# Patient Record
Sex: Female | Born: 1995 | Hispanic: Yes | Marital: Single | Smoking: Never smoker
Health system: Southern US, Community
[De-identification: ages and names within clinical notes are randomized; demographics above are authoritative.]

## PROBLEM LIST (undated history)

## (undated) ENCOUNTER — Inpatient Hospital Stay (HOSPITAL_COMMUNITY): Payer: Self-pay

---
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
## 2017-01-01 ENCOUNTER — Ambulatory Visit: Payer: Self-pay | Admitting: General Practice

## 2017-01-01 DIAGNOSIS — O3680X0 Pregnancy with inconclusive fetal viability, not applicable or unspecified: Secondary | ICD-10-CM

## 2017-01-01 LAB — HCG, QUANTITATIVE, PREGNANCY: hCG, Beta Chain, Quant, S: 203 m[IU]/mL — ABNORMAL HIGH (ref <5)

## 2017-01-01 NOTE — Progress Notes (Signed)
Patient here for stat bhcg today. Spoke with patient with Okey RegalCarol for interpreter. Patient denies pain or bleeding. Discussed having patient wait in lobby for results & updated plan of care. Patient verbalized understanding. Discussed results with Dr Macon LargeAnyanwu who states bhcg is plateauing and not rising appropriately. Most likely represents non progressing pregnancy. Patient needs repeat bhcg in 48 hours, if levels are almost the same as today patient may need MTX for presumed ectopic pregnancy. Spoke with patient using stratus interpreter [phone]#[id number] & informed her results and updated plan of care. Patient verbalized understanding and boyfriend asked if there was anything they could do to raise the levels or help. Told them unfortunately no at this point we just have to wait and see. They verbalized understanding and had no questions at this time.

## 2017-01-03 ENCOUNTER — Inpatient Hospital Stay (HOSPITAL_COMMUNITY)
Admission: AD | Admit: 2017-01-03 | Discharge: 2017-01-03 | Disposition: A | Discharge status: Home / Self Care | Payer: Self-pay | Source: Ambulatory Visit | Attending: Family Medicine | Admitting: Family Medicine

## 2017-01-03 DIAGNOSIS — O3680X0 Pregnancy with inconclusive fetal viability, not applicable or unspecified: Secondary | ICD-10-CM

## 2017-01-03 DIAGNOSIS — O0281 Inappropriate change in quantitative human chorionic gonadotropin (hCG) in early pregnancy: Secondary | ICD-10-CM | POA: Insufficient documentation

## 2017-01-03 LAB — HCG, QUANTITATIVE, PREGNANCY: hCG, Beta Chain, Quant, S: 321 m[IU]/mL — ABNORMAL HIGH (ref <5)

## 2017-01-03 NOTE — MAU Note (Cosign Needed)
Ms. Olivia MoundOsielit Buttram  is a 21 y.o. G2P0010 at 8933w0d who presents to MAU today for follow-up quant hCG after 48 hours. The patient was seen in MAU on 12/30/16 and had quant hCG of 240 and US showed no IUP or adnexal  mass. [facility] and quant was 203. She denies pain, vaginal bleeding or fever today.   OB History  Gravida Para Term Preterm AB Living  2 0 0 0 1 0  SAB TAB Ectopic Multiple Live Births  1 0 0 0 0    # Outcome Date GA Lbr Len/2nd Weight Sex Delivery Anes PTL Lv  2 Current           1 SAB 05/26/13     SAB         No past medical history on file.  ROS: No VB No pain  BP 122/70 (BP Location: Left Arm)   Pulse 78   Temp 98.6 F (37 C) (Oral)   Resp 16   LMP 11/15/2016   SpO2 100%   CONSTITUTIONAL: Well-developed, well-nourished female in no acute distress.  MUSCULOSKELETAL: Normal range of motion.  CARDIOVASCULAR: Regular heart rate RESPIRATORY: Normal effort NEUROLOGICAL: Alert and oriented to person, place, and time.  SKIN: Not diaphoretic. No erythema. No pallor. PSYCH: Normal mood and affect. Normal behavior. Normal judgment and thought content.  Results for orders placed or performed during the hospital encounter of 01/03/17 (from the past 24 hour(s))  hCG, quantitative, pregnancy     Status: Abnormal   Collection Time: 01/03/17  1:16 PM  Result Value Ref Range   hCG, Beta Chain, Quant, S 321 (H) <5 mIU/mL    MDM: Consult with Dr. Shawnie PonsPratt. Abnormal rise in quant (<66%). Will rpt quant in 2 days and consider MTX at that time if continues at inappropriate level.   A: Pregnancy of unknown location Inappropriate rise in quant hCG after 48 hours  P: Discharge home First trimester/ectopic precautions discussed [facility] @11  am for quant Patient may return to MAU as needed or if her condition were to change or worsen  Interpreter present for encounter  Donette LarryBhambri, Anushka Hartinger, CNM 01/03/2017 2:11 PM

## 2017-01-03 NOTE — MAU Note (Signed)
Here for repeat blood work.  States is feeling better, denies pain, bleeding has stopped.

## 2017-01-05 ENCOUNTER — Ambulatory Visit: Payer: Self-pay

## 2017-01-05 ENCOUNTER — Inpatient Hospital Stay (HOSPITAL_COMMUNITY)
Admission: AD | Admit: 2017-01-05 | Discharge: 2017-01-05 | Disposition: A | Discharge status: Home / Self Care | Payer: Self-pay | Source: Ambulatory Visit | Attending: Obstetrics & Gynecology | Admitting: Obstetrics & Gynecology

## 2017-01-05 DIAGNOSIS — O3680X0 Pregnancy with inconclusive fetal viability, not applicable or unspecified: Secondary | ICD-10-CM

## 2017-01-05 LAB — HCG, QUANTITATIVE, PREGNANCY: hCG, Beta Chain, Quant, S: 406 m[IU]/mL — ABNORMAL HIGH (ref <5)

## 2017-01-05 NOTE — Progress Notes (Signed)
Patient presented to office today for a quant  check. Patient reports no bleeding or pain at this time. Per Dr.Phelps patient should have a repeat us scheduled this week. Patient quant levels increase but not double at this time. Phelps recommend patient has a repeat u/s. Ultrasound scheduled for 01/07/2017 @ 9:00. Patient voice understanding at this time.

## 2017-01-07 ENCOUNTER — Ambulatory Visit (HOSPITAL_COMMUNITY)
Admission: RE | Admit: 2017-01-07 | Discharge: 2017-01-07 | Disposition: A | Discharge status: Home / Self Care | Payer: Self-pay | Source: Ambulatory Visit | Attending: Obstetrics and Gynecology | Admitting: Obstetrics and Gynecology

## 2017-01-07 ENCOUNTER — Ambulatory Visit: Payer: Self-pay

## 2017-01-07 DIAGNOSIS — O3680X0 Pregnancy with inconclusive fetal viability, not applicable or unspecified: Secondary | ICD-10-CM | POA: Insufficient documentation

## 2017-01-07 DIAGNOSIS — Z3201 Encounter for pregnancy test, result positive: Secondary | ICD-10-CM | POA: Insufficient documentation

## 2017-01-07 DIAGNOSIS — Z3A00 Weeks of gestation of pregnancy not specified: Secondary | ICD-10-CM | POA: Insufficient documentation

## 2017-01-07 DIAGNOSIS — Z3401 Encounter for supervision of normal first pregnancy, first trimester: Secondary | ICD-10-CM

## 2017-01-07 NOTE — Progress Notes (Signed)
Patient presented to the office for US results. Explained results to patient and scheduled US for 7-14 days.  Patient verbalized understanding and had no questions.

## 2017-01-08 ENCOUNTER — Telehealth: Payer: Self-pay | Admitting: Obstetrics and Gynecology

## 2017-01-08 ENCOUNTER — Inpatient Hospital Stay (HOSPITAL_COMMUNITY)
Admission: AD | Admit: 2017-01-08 | Discharge: 2017-01-08 | Disposition: A | Discharge status: Home / Self Care | Payer: Self-pay | Source: Ambulatory Visit | Attending: Family Medicine | Admitting: Family Medicine

## 2017-01-08 DIAGNOSIS — O9989 Other specified diseases and conditions complicating pregnancy, childbirth and the puerperium: Secondary | ICD-10-CM

## 2017-01-08 DIAGNOSIS — O3680X0 Pregnancy with inconclusive fetal viability, not applicable or unspecified: Secondary | ICD-10-CM

## 2017-01-08 DIAGNOSIS — Z3480 Encounter for supervision of other normal pregnancy, unspecified trimester: Secondary | ICD-10-CM

## 2017-01-08 DIAGNOSIS — O00109 Unspecified tubal pregnancy without intrauterine pregnancy: Secondary | ICD-10-CM | POA: Insufficient documentation

## 2017-01-08 LAB — CBC
HEMATOCRIT: 36.3 % (ref 36.0–46.0)
HEMOGLOBIN: 12.4 g/dL (ref 12.0–15.0)
MCH: 29 pg (ref 26.0–34.0)
MCHC: 34.2 g/dL (ref 30.0–36.0)
MCV: 85 fL (ref 78.0–100.0)
Platelets: 252 10*3/uL (ref 150–400)
RBC: 4.27 MIL/uL (ref 3.87–5.11)
RDW: 12.5 % (ref 11.5–15.5)
WBC: 8.4 10*3/uL (ref 4.0–10.5)

## 2017-01-08 LAB — COMPREHENSIVE METABOLIC PANEL
ALBUMIN: 3.9 g/dL (ref 3.5–5.0)
ALK PHOS: 40 U/L (ref 38–126)
ALT: 16 U/L (ref 14–54)
AST: 21 U/L (ref 15–41)
Anion gap: 9 (ref 5–15)
BILIRUBIN TOTAL: 0.5 mg/dL (ref 0.3–1.2)
BUN: 12 mg/dL (ref 6–20)
CALCIUM: 8.9 mg/dL (ref 8.9–10.3)
CO2: 21 mmol/L — AB (ref 22–32)
CREATININE: 0.61 mg/dL (ref 0.44–1.00)
Chloride: 106 mmol/L (ref 101–111)
GFR calc Af Amer: >60 mL/min (ref >60)
GFR calc non Af Amer: >60 mL/min (ref >60)
GLUCOSE: 99 mg/dL (ref 65–99)
Potassium: 4.5 mmol/L (ref 3.5–5.1)
SODIUM: 136 mmol/L (ref 135–145)
TOTAL PROTEIN: 6.5 g/dL (ref 6.5–8.1)

## 2017-01-08 LAB — HCG, QUANTITATIVE, PREGNANCY: hCG, Beta Chain, Quant, S: 817 m[IU]/mL — ABNORMAL HIGH (ref <5)

## 2017-01-08 MED ORDER — METHOTREXATE INJECTION FOR WOMEN'S HOSPITAL
50.0000 mg/m2 | Freq: Once | INTRAMUSCULAR | Status: DC
  Filled 2017-01-08: qty 1.7

## 2017-01-08 NOTE — MAU Note (Signed)
Here for ectopic/MTX work up.  Been feeling well, no problems.  Pt denies pain and or bleeding.

## 2017-01-08 NOTE — Discharge Instructions (Signed)
Embarazo ectópico °(Ectopic Pregnancy) °Un embarazo ectópico ocurre cuando un óvulo fecundado se desarrolla fuera del útero. Un embarazo no puede subsistir fuera del útero. Este problema generalmente ocurre en las trompas de [place]. La causa es, con frecuencia, un daño en la trompa de [place]. °Si el problema se detecta a tiempo, puede tratarse con medicamentos. Si el conducto se fisura o estalla (hay ruptura), tendrá una hemorragia interna. Esto es una emergencia. Deberá someterse a una cirugía. Solicite ayuda de inmediato. °SÍNTOMAS °Al principio puede tener síntomas de un embarazo normal. Estos pueden ser: °· Falta del período menstrual. °· Ganas de vomitar (náuseas). °· Sensación de cansancio. °· Hinchazón de las mamas. °Luego podrá a comenzar a tener síntomas que no son normales. Estos pueden ser: °· Dolor durante el coito (relación sexual). °· Hemorragia por la vagina. Esto incluye el sangrado leve (pérdidas). °· Calambres o dolor en el vientre (abdomen) o en la zona inferior del vientre. Estos pueden sentirse en uno de los lados. °· Latidos cardíacos rápidos (pulso). °· Perder la conciencia (desmayarse) después de ir de cuerpo (defecar). °Si el conducto se rompe, puede tener síntomas como: °· Dolor muy intenso en el vientre o parte baja del vientre. Esto se produce repentinamente. °· Mareos. °· Desmayo. °· Dolor en el hombro. °SOLICITE AYUDA DE INMEDIATO SI: °Tiene alguno de estos síntomas. Esto es una emergencia. °ASEGÚRESE DE QUE: °· Comprende estas instrucciones. °· Controlará su afección. °· Recibirá ayuda de inmediato si no mejora o si empeora. ° °Esta información no tiene como fin reemplazar el consejo del médico. Asegúrese de hacerle al médico cualquier pregunta que tenga. °Document Released: 05/01/2011 Document Revised: 05/17/2013 Document Reviewed: 12/22/2012 °Elsevier Interactive Patient Education © 2017 [employer] ° °

## 2017-01-08 NOTE — MAU Provider Note (Signed)
Patient Olivia Medina is a 21 y.o. G2P0010 At 7189w5d by LMP here for methotrexate treatment. Patient has had inappropriate rise in quants; patient was called and asked to come in for evaluation and to discuss methotrexate.  Upon presentation to MAU, patient denied bleeding, abdominal pain or any other ob-gyn complaints.  History     CSN: [id number]  Arrival date and time: 01/08/17 1721   None     No chief complaint on file.  HPI Patient has been followed for serial quants.  Quants  Are as follows:  8/7: 204 8/9: 203 8/11: 321 8/13: 406 8/16: 817  Patient has had one US, on 8/7 and 8/15. On both occasions pregnancy of unknown anatomic location was diagnosed.    OB History    Gravida Para Term Preterm AB Living   2 0 0 0 1 0   SAB TAB Ectopic Multiple Live Births   1 0 0 0 0      No past medical history on file.  No past surgical history on file.  Family History  Problem Relation Age of Onset  . Heart attack Father     Social History  Substance Use Topics  . Smoking status: Never Smoker  . Smokeless tobacco: Never Used  . Alcohol use No    Allergies: No Known Allergies  No prescriptions prior to admission.    Review of Systems  HENT: Negative.   Respiratory: Negative.   Cardiovascular: Negative.   Gastrointestinal: Negative.   Genitourinary: Negative.   Musculoskeletal: Negative.    Physical Exam   Blood pressure 109/62, pulse 71, temperature 98.6 F (37 C), temperature source Oral, resp. rate 16, height 5\' 4"  (1.626 m), weight 147 lb 8 oz (66.9 kg), last menstrual period 11/15/2016, SpO2 100 %.  Physical Exam  Constitutional: She is oriented to person, place, and time. She appears well-developed.  HENT:  Head: Normocephalic.  Neck: Normal range of motion.  Cardiovascular: Normal rate.   Respiratory: Effort normal.  GI: Soft.  Musculoskeletal: Normal range of motion.  Neurological: She is alert and oriented to person, place, and time.  Skin:  Skin is warm and dry.  Psychiatric: She has a normal mood and affect.    MAU Course  Procedures  MDM Discussesd patient's results with Dr. Shawnie PonsPratt, who recommends the patient decide which course of action is most appropriate for her. I explained the possibility that she may have a viable pregnancy or an ectopic pregnancy or a recent miscarriage. I explained in detail the three scenarios with the patient, and the risks of waiting for a repeat quant (possible rupture of ectopic pregnancy)  Or taking the methotrexate now and risk interrupting a viable pregnancy. Patient and her boyfriend, after much consultation between themselves, opted to return one more time for a repeat quant on Saturday. I emphasized to the patient the importance of returning on Saturday, not Sunday, for a quant in order to help us appropriately interpret her results.  Patient and her partner verbalized understanding.   Assessment and Plan   1. Pregnancy of unknown anatomic location   2. Supervision of other normal pregnancy, antepartum    3. Patient and her partner agree to return to MAU if she has any bleeding, cramping or other abdominal pain. Otherwise, they will return for another beta hcg on Saturday, January 09, 2017.  4. Updated Dr. Shawnie PonsPratt on patient's plan of care; Dr. Shawnie PonsPratt in agreement.   Olivia Medina CNM 01/08/2017, 8:07 PM

## 2017-01-08 NOTE — Telephone Encounter (Signed)
Called to discuss recent US findings and bHCG labs with patient. She does not speak [place] but her partner Derek MoundRicardo who has been known to interpret for her was given the results. Identity verifiDiscussed how patient most likely has a non-viable pregnancy that is outside of the uterus. Instructed him to bring patient into MAU for treatment of ectopic pregnancy. MAU providers aware.   Olivia AdaJazma Crislyn Willbanks, DO OB Fellow Faculty Practice, [facility] 01/08/2017, 4:31 PM

## 2017-01-10 ENCOUNTER — Inpatient Hospital Stay (HOSPITAL_COMMUNITY)
Admission: AD | Admit: 2017-01-10 | Discharge: 2017-01-10 | Disposition: A | Discharge status: Home / Self Care | Payer: Self-pay | Source: Ambulatory Visit | Attending: Obstetrics and Gynecology | Admitting: Obstetrics and Gynecology

## 2017-01-10 DIAGNOSIS — O0281 Inappropriate change in quantitative human chorionic gonadotropin (hCG) in early pregnancy: Secondary | ICD-10-CM | POA: Insufficient documentation

## 2017-01-10 DIAGNOSIS — O26891 Other specified pregnancy related conditions, first trimester: Secondary | ICD-10-CM | POA: Insufficient documentation

## 2017-01-10 DIAGNOSIS — Z3A01 Less than 8 weeks gestation of pregnancy: Secondary | ICD-10-CM | POA: Insufficient documentation

## 2017-01-10 DIAGNOSIS — O3680X0 Pregnancy with inconclusive fetal viability, not applicable or unspecified: Secondary | ICD-10-CM

## 2017-01-10 LAB — CBC WITH DIFFERENTIAL/PLATELET
BASOS ABS: 0 10*3/uL (ref 0.0–0.1)
BASOS PCT: 0 %
Eosinophils Absolute: 0.2 10*3/uL (ref 0.0–0.7)
Eosinophils Relative: 4 %
HEMATOCRIT: 37.3 % (ref 36.0–46.0)
HEMOGLOBIN: 12.7 g/dL (ref 12.0–15.0)
Lymphocytes Relative: 40 %
Lymphs Abs: 2.8 10*3/uL (ref 0.7–4.0)
MCH: 29 pg (ref 26.0–34.0)
MCHC: 34 g/dL (ref 30.0–36.0)
MCV: 85.2 fL (ref 78.0–100.0)
MONO ABS: 0.3 10*3/uL (ref 0.1–1.0)
MONOS PCT: 5 %
Neutro Abs: 3.6 10*3/uL (ref 1.7–7.7)
Neutrophils Relative %: 51 %
Platelets: 246 10*3/uL (ref 150–400)
RBC: 4.38 MIL/uL (ref 3.87–5.11)
RDW: 12.3 % (ref 11.5–15.5)
WBC: 6.9 10*3/uL (ref 4.0–10.5)

## 2017-01-10 LAB — COMPREHENSIVE METABOLIC PANEL
ALBUMIN: 4.2 g/dL (ref 3.5–5.0)
ALK PHOS: 49 U/L (ref 38–126)
ALT: 17 U/L (ref 14–54)
AST: 21 U/L (ref 15–41)
Anion gap: 8 (ref 5–15)
BILIRUBIN TOTAL: 0.5 mg/dL (ref 0.3–1.2)
BUN: 10 mg/dL (ref 6–20)
CALCIUM: 9.3 mg/dL (ref 8.9–10.3)
CO2: 25 mmol/L (ref 22–32)
CREATININE: 0.49 mg/dL (ref 0.44–1.00)
Chloride: 105 mmol/L (ref 101–111)
GFR calc Af Amer: >60 mL/min (ref >60)
GLUCOSE: 92 mg/dL (ref 65–99)
Potassium: 4.5 mmol/L (ref 3.5–5.1)
Sodium: 138 mmol/L (ref 135–145)
TOTAL PROTEIN: 7.1 g/dL (ref 6.5–8.1)

## 2017-01-10 LAB — HCG, QUANTITATIVE, PREGNANCY: hCG, Beta Chain, Quant, S: 1126 m[IU]/mL — ABNORMAL HIGH (ref <5)

## 2017-01-10 MED ORDER — METHOTREXATE INJECTION FOR WOMEN'S HOSPITAL
50.0000 mg/m2 | Freq: Once | INTRAMUSCULAR | Status: AC
  Administered 2017-01-10: 85 mg via INTRAMUSCULAR
  Filled 2017-01-10: qty 1.7

## 2017-01-10 NOTE — Discharge Instructions (Signed)
Tratamiento con metotrexato para el embarazo ectópico, cuidados posteriores  (Methotrexate Treatment for an Ectopic Pregnancy, Care After)  Siga estas indicaciones durante las próximas semanas. Estas indicaciones le proporcionan información general acerca de cómo deberá cuidarse después del procedimiento. El médico también podrá darle indicaciones más específicas. El tratamiento se ha planificado de acuerdo a las prácticas médicas actuales, pero a veces pueden ocurrir problemas. Comuníquese con el médico si tiene algún problema o tiene dudas después del procedimiento.  QUÉ ESPERAR DESPUÉS DEL PROCEDIMIENTO  Puede tener algunos cólicos abdominales, hemorragia vaginal y fatiga durante los primeros días después de recibir el metotrexato. Otros efectos secundarios posibles del metotrexato incluyen:  · Náuseas.  · Vómitos.  · Diarrea.  · Llagas en la boca.  · Hinchazón o irritación del revestimiento pulmonar (neumonitis).  · Daño hepático.  · Pérdida del cabello.  INSTRUCCIONES PARA EL CUIDADO EN EL HOGAR  Luego de recibir el metotrexato, debe tener cuidado con las actividades que realiza, y controlar su afección durante algunas semanas. Puede pasar 1 semana antes de que los niveles hormonales se normalicen.  · Cumpla con todas las visitas de control, según le indique su médico.  · Evite viajar a sitios alejados de donde esté su médico.  · No tenga relaciones sexuales hasta que el médico le diga que es seguro.  · Puede retomar su dieta habitual.  · Limite las actividades extenuantes.  · No tome ácido fólico, vitaminas prenatales ni otras vitaminas que contengan ácido fólico.  · No tome aspirina, ibuprofeno ni naproxeno (antiinflamatorios no esteroides [AINE]).  · No beba alcohol.  SOLICITE ATENCIÓN MÉDICA SI:  · No puede controlar las náuseas y los vómitos.  · No puede controlar la diarrea.  · Tiene llagas en la boca y quiere tratamiento.  · Necesita analgésicos más fuertes para su dolor.  · Tiene una erupción  cutánea.  · Tiene una reacción adversa a los medicamentos.    SOLICITE ATENCIÓN MÉDICA DE INMEDIATO SI:  · Tiene cada vez más dolor abdominal o pélvico.  · Observa un aumento de la hemorragia.  · Se siente mareada o se desmaya.  · Le falta el aire.  · Aumenta su frecuencia cardíaca.  · Tiene tos.  · Tiene escalofríos.  · Tiene fiebre.    Esta información no tiene como fin reemplazar el consejo del médico. Asegúrese de hacerle al médico cualquier pregunta que tenga.  Document Released: 05/01/2011 Document Revised: 05/17/2013 Document Reviewed: 02/28/2013  Elsevier Interactive Patient Education © 2017 Elsevier Inc.

## 2017-01-10 NOTE — MAU Note (Signed)
Patient presents for follow up blood work and possibly Methotrexate injection, no pain, no vaginal bleeding.

## 2017-01-10 NOTE — MAU Note (Signed)
Olivia Medina in house spanish interpreter available for explanation of Methotrexate injection. Patient was given a handout on ectopic pregnancy and Methotrexate.

## 2017-01-10 NOTE — MAU Provider Note (Signed)
S:  Olivia Medina is a 21 y.o. female G2P0010 @ 88w0ddiagnosed with a pregnancy of unknown location, highly suspicious for an ectopic pregnancy. She is here for a repeat quant.  She denies abdominal pain, or vaginal bleeding at this time.  Hospital spanish interpretor at bedside.    Patient has been followed for serial quants, Quants are as follows: 8/7: 204 8/9: 203 8/11: 321 8/13: 406 8/16: 817 8/18: 1126  The patient was offered and counseled regarding MTX on 8/16, however her quant went from 406 to 817 and she opted to have another quant drawn.    O:  GENERAL: Well-developed, well-nourished female in no acute distress.  LUNGS: Effort normal SKIN: Warm, dry and without erythema PSYCH: Normal mood and affect  Vitals:   01/10/17 1113  BP: (!) 116/58  Pulse: 92  Resp: 16  Temp: 98.4 F (36.9 C)    TECHNIQUE: Both transabdominal and transvaginal ultrasound examinations were performed for complete evaluation of the gestation as well as the maternal uterus, adnexal regions, and pelvic cul-de-sac. Transvaginal technique was performed to assess early pregnancy.  COMPARISON:  None.  FINDINGS: Intrauterine gestational sac: Not Visualized.  Yolk sac:  Not Visualized.  Embryo:  Not Visualized.  Cardiac Activity: Not Visualized.  Heart Rate: Not applicable  Subchorionic hemorrhage:  None visualized.  Maternal uterus/adnexae: Small corpus luteal cyst noted of the right ovary. Left ovary is unremarkable. Trace free fluid is seen in the pelvis.  IMPRESSION: No intrauterine or ectopic pregnancy is noted.   Electronically Signed   By: DAshley RoyaltyM.D.   On: 12/30/2016 21:45     CLINICAL DATA:  Recent ultrasound without visualization of intrauterine gestation. Positive pregnancy test.  EXAM: TRANSVAGINAL OB ULTRASOUND; [place] COMPLETE  TECHNIQUE: Transvaginal ultrasound was performed for complete evaluation of the gestation as well as  the maternal uterus, adnexal regions, and pelvic cul-de-sac.  COMPARISON:  December 30, 2016.  FINDINGS: Intrauterine gestational sac: Not visualized  Yolk sac:  Not visualized  Embryo:  Not visualized  Cardiac Activity: Not visualized  Subchorionic hemorrhage:  None visualized.  Maternal uterus/adnexae: Uterus measures 6.5 x 4.8 x 3.2 cm. No intrauterine mass. Cervical os is closed. Endometrium measures 5 mm in diameter with a smooth contour. Right ovary measures 2.9 x 1.7 x 1.8 cm. Left ovary measures 2.2 x 1.3 x 1.7 cm. There is no appreciable extrauterine pelvic or adnexal mass. There is rather minimal free pelvic fluid.  IMPRESSION: There is no demonstrable intrauterine gestation. There is no pelvic mass or inflammatory focus. There is rather minimal free fluid in the pelvis which may be physiologic.  Differential considerations for this circumstance and presence of positive pregnancy test include intrauterine gestation too early to be seen by either transabdominal or transvaginal technique; recent spontaneous abortion ; possible ectopic gestation. These findings warrant close clinical and laboratory correlation. Timing of repeat ultrasound in part will depend on beta HCG values going forward.   Electronically Signed   By: WLowella GripIII M.D.   On: 01/07/2017 09:40   Results for orders placed or performed during the hospital encounter of 01/10/17 (from the past 48 hour(s))  hCG, quantitative, pregnancy     Status: Abnormal   Collection Time: 01/10/17 11:11 AM  Result Value Ref Range   hCG, Beta Chain, Quant, S 1,126 (H) <5 mIU/mL    Comment:          GEST. AGE      CONC.  (mIU/mL)   <=  1 WEEK        5 - 50     2 WEEKS       50 - 500     3 WEEKS       100 - 10,000     4 WEEKS     1,000 - 30,000     5 WEEKS     3,500 - 115,000   6-8 WEEKS     12,000 - 270,000    12 WEEKS     15,000 - 220,000        FEMALE AND NON-PREGNANT FEMALE:     LESS THAN  5 mIU/mL   CBC with Differential     Status: None   Collection Time: 01/10/17  1:11 PM  Result Value Ref Range   WBC 6.9 4.0 - 10.5 K/uL   RBC 4.38 3.87 - 5.11 MIL/uL   Hemoglobin 12.7 12.0 - 15.0 g/dL   HCT 37.3 36.0 - 46.0 %   MCV 85.2 78.0 - 100.0 fL   MCH 29.0 26.0 - 34.0 pg   MCHC 34.0 30.0 - 36.0 g/dL   RDW 12.3 11.5 - 15.5 %   Platelets 246 150 - 400 K/uL   Neutrophils Relative % 51 %   Neutro Abs 3.6 1.7 - 7.7 K/uL   Lymphocytes Relative 40 %   Lymphs Abs 2.8 0.7 - 4.0 K/uL   Monocytes Relative 5 %   Monocytes Absolute 0.3 0.1 - 1.0 K/uL   Eosinophils Relative 4 %   Eosinophils Absolute 0.2 0.0 - 0.7 K/uL   Basophils Relative 0 %   Basophils Absolute 0.0 0.0 - 0.1 K/uL  Comprehensive metabolic panel     Status: None   Collection Time: 01/10/17  1:11 PM  Result Value Ref Range   Sodium 138 135 - 145 mmol/L   Potassium 4.5 3.5 - 5.1 mmol/L   Chloride 105 101 - 111 mmol/L   CO2 25 22 - 32 mmol/L   Glucose, Bld 92 65 - 99 mg/dL   BUN 10 6 - 20 mg/dL   Creatinine, Ser 0.49 0.44 - 1.00 mg/dL   Calcium 9.3 8.9 - 10.3 mg/dL   Total Protein 7.1 6.5 - 8.1 g/dL   Albumin 4.2 3.5 - 5.0 g/dL   AST 21 15 - 41 U/L   ALT 17 14 - 54 U/L   Alkaline Phosphatase 49 38 - 126 U/L   Total Bilirubin 0.5 0.3 - 1.2 mg/dL   GFR calc non Af Amer >60 >60 mL/min   GFR calc Af Amer >60 >60 mL/min    Comment: (NOTE) The eGFR has been calculated using the CKD EPI equation. This calculation has not been validated in all clinical situations. eGFR's persistently <60 mL/min signify possible Chronic Kidney Disease.    Anion gap 8 5 - 15    MDM  Discussed Hcg results in detail with the patient.  MTX recommended, and patient is agreeable. Questions answered in detail with spanish interpretor present.  CBC & CMP MTX given   A:  1. Pregnancy of unknown anatomic location   2. Inappropriate change in quantitative human chorionic gonadotropin (hCG) in early pregnancy     P:  Discharge  home in stable condition Avoid ibuprofen, green leafy vegetables, stop prenatal vitamin Return to MAU if symptoms worsen Follow up on Tuesday for Day 4 MTX in the [facility].  Ectopic precautions Pelvic rest Support given.  Lezlie Lye, NP 01/10/2017 7:54 PM

## 2017-01-11 ENCOUNTER — Other Ambulatory Visit: Payer: Self-pay | Admitting: Student

## 2017-01-13 ENCOUNTER — Ambulatory Visit: Payer: Self-pay

## 2017-01-13 DIAGNOSIS — O009 Unspecified ectopic pregnancy without intrauterine pregnancy: Secondary | ICD-10-CM

## 2017-01-13 LAB — HCG, QUANTITATIVE, PREGNANCY: hCG, Beta Chain, Quant, S: 2033 m[IU]/mL — ABNORMAL HIGH (ref <5)

## 2017-01-13 NOTE — Progress Notes (Signed)
Patient presented to the office today for her hcg check stat. Patient reports having no pain or pressure. On 01/10/2017 patient received MTX in the MAU and was told to follow up today for repeat stat quant check. Patient results show that quant has increase some. Per Dr.Phelps it is normal for quant to rise some after receiving MTX. She should follow up on 01/17/2017 at the MAU for next quant at that time it will be determine if she will get another does of MTX. Patient voice understanding at this time and plans to follow up on 01/17/2017 in the MAU.

## 2017-01-15 ENCOUNTER — Ambulatory Visit (HOSPITAL_COMMUNITY): Admission: RE | Admit: 2017-01-15 | Payer: Self-pay | Source: Ambulatory Visit

## 2017-01-17 ENCOUNTER — Inpatient Hospital Stay (HOSPITAL_COMMUNITY)
Admission: AD | Admit: 2017-01-17 | Discharge: 2017-01-17 | Disposition: A | Discharge status: Home / Self Care | Payer: Self-pay | Source: Ambulatory Visit | Attending: Obstetrics and Gynecology | Admitting: Obstetrics and Gynecology

## 2017-01-17 DIAGNOSIS — Z8249 Family history of ischemic heart disease and other diseases of the circulatory system: Secondary | ICD-10-CM | POA: Insufficient documentation

## 2017-01-17 DIAGNOSIS — O3680X0 Pregnancy with inconclusive fetal viability, not applicable or unspecified: Secondary | ICD-10-CM | POA: Insufficient documentation

## 2017-01-17 DIAGNOSIS — Z3A09 9 weeks gestation of pregnancy: Secondary | ICD-10-CM | POA: Insufficient documentation

## 2017-01-17 LAB — HCG, QUANTITATIVE, PREGNANCY: hCG, Beta Chain, Quant, S: 1348 m[IU]/mL — ABNORMAL HIGH (ref <5)

## 2017-01-17 NOTE — MAU Note (Signed)
Called for patient not in lobby, Called contact number and patient will return in one hour.  Explained need to be here for her results.

## 2017-01-17 NOTE — MAU Provider Note (Signed)
Ms. Olivia Medina  is a 21 y.o. G2P0010 at [redacted]w[redacted]d who presents to MAU today for follow-up quant hCG on day #7 after MTX. Patient was given MTX for abnormally rising quant hCG levels and pregnancy of unknown location. The patient states scant spotting noted occasionally. She denies pain or fever today.   No past medical history on file.  No past surgical history on file.  Family History  Problem Relation Age of Onset  . Heart attack Father     Social History   Social History  . Marital status: Single    Spouse name: N/A  . Number of children: N/A  . Years of education: N/A   Occupational History  . Not on file.   Social History Main Topics  . Smoking status: Never Smoker  . Smokeless tobacco: Never Used  . Alcohol use No  . Drug use: No  . Sexual activity: No   Other Topics Concern  . Not on file   Social History Narrative  . No narrative on file    BP (!) 113/59 (BP Location: Right Arm)   Pulse 84   Temp 98.6 F (37 C)   Resp 16   LMP 11/15/2016   CONSTITUTIONAL: Well-developed, well-nourished female in no acute distress.  ENT: External right and left ear normal.  EYES: EOM intact, conjunctivae normal.  MUSCULOSKELETAL: Normal range of motion.  CARDIOVASCULAR: Regular heart rate RESPIRATORY: Normal effort NEUROLOGICAL: Alert and oriented to person, place, and time.  SKIN: Skin is warm and dry. No rash noted. Not diaphoretic. No erythema. No pallor. PSYCH: Normal mood and affect. Normal behavior. Normal judgment and thought content.  Results for JESICCA, MORDECAI (MRN [id number]) as of 01/17/2017 15:13  Ref. Range 01/03/2017 13:16 01/05/2017 12:10 01/07/2017 09:34 01/08/2017 17:53 01/10/2017 11:11 01/10/2017 13:11 01/13/2017 11:47 01/17/2017 12:18  HCG, Beta Chain, Quant, S Latest Ref Range: <5 mIU/mL 321 (H) 406 (H)  817 (H) 1,126 (H)  2,033 (H) 1,348 (H)   A: Pregnancy of unknown location Appropriate decline on day #7 after MTX  P: Discharge home Ectopic  precautions discussed Patient wishes to have weekly follow-up at [facility]. In-basket message sent to schedule lab and provider follow-up visits. They will call the patient with appointments.  Patient may return to MAU as needed or if her condition were to change or worsen   Kathlene Cote 01/17/2017 3:13 PM

## 2017-01-17 NOTE — Discharge Instructions (Signed)
Tratamiento con metotrexato para el embarazo ectópico, cuidados posteriores  (Methotrexate Treatment for an Ectopic Pregnancy, Care After)  Siga estas indicaciones durante las próximas semanas. Estas indicaciones le proporcionan información general acerca de cómo deberá cuidarse después del procedimiento. El médico también podrá darle indicaciones más específicas. El tratamiento se ha planificado de acuerdo a las prácticas médicas actuales, pero a veces pueden ocurrir problemas. Comuníquese con el médico si tiene algún problema o tiene dudas después del procedimiento.  QUÉ ESPERAR DESPUÉS DEL PROCEDIMIENTO  Puede tener algunos cólicos abdominales, hemorragia vaginal y fatiga durante los primeros días después de recibir el metotrexato. Otros efectos secundarios posibles del metotrexato incluyen:  · Náuseas.  · Vómitos.  · Diarrea.  · Llagas en la boca.  · Hinchazón o irritación del revestimiento pulmonar (neumonitis).  · Daño hepático.  · Pérdida del cabello.  INSTRUCCIONES PARA EL CUIDADO EN EL HOGAR  Luego de recibir el metotrexato, debe tener cuidado con las actividades que realiza, y controlar su afección durante algunas semanas. Puede pasar 1 semana antes de que los niveles hormonales se normalicen.  · Cumpla con todas las visitas de control, según le indique su médico.  · Evite viajar a sitios alejados de donde esté su médico.  · No tenga relaciones sexuales hasta que el médico le diga que es seguro.  · Puede retomar su dieta habitual.  · Limite las actividades extenuantes.  · No tome ácido fólico, vitaminas prenatales ni otras vitaminas que contengan ácido fólico.  · No tome aspirina, ibuprofeno ni naproxeno (antiinflamatorios no esteroides [AINE]).  · No beba alcohol.  SOLICITE ATENCIÓN MÉDICA SI:  · No puede controlar las náuseas y los vómitos.  · No puede controlar la diarrea.  · Tiene llagas en la boca y quiere tratamiento.  · Necesita analgésicos más fuertes para su dolor.  · Tiene una erupción  cutánea.  · Tiene una reacción adversa a los medicamentos.    SOLICITE ATENCIÓN MÉDICA DE INMEDIATO SI:  · Tiene cada vez más dolor abdominal o pélvico.  · Observa un aumento de la hemorragia.  · Se siente mareada o se desmaya.  · Le falta el aire.  · Aumenta su frecuencia cardíaca.  · Tiene tos.  · Tiene escalofríos.  · Tiene fiebre.    Esta información no tiene como fin reemplazar el consejo del médico. Asegúrese de hacerle al médico cualquier pregunta que tenga.  Document Released: 05/01/2011 Document Revised: 05/17/2013 Document Reviewed: 02/28/2013  Elsevier Interactive Patient Education © 2017 Elsevier Inc.

## 2017-01-17 NOTE — MAU Note (Signed)
Denies pain today, a couple of days ago started having vaginal bleeding light.

## 2017-01-21 ENCOUNTER — Encounter: Payer: Self-pay | Admitting: Student

## 2017-01-23 ENCOUNTER — Other Ambulatory Visit: Payer: Self-pay | Admitting: *Deleted

## 2017-02-03 ENCOUNTER — Ambulatory Visit: Payer: Medicaid Other | Admitting: Obstetrics and Gynecology

## 2017-02-03 ENCOUNTER — Telehealth: Payer: Self-pay | Admitting: Obstetrics and Gynecology

## 2017-02-03 ENCOUNTER — Encounter: Payer: Self-pay | Admitting: *Deleted

## 2017-02-03 ENCOUNTER — Encounter: Payer: Self-pay | Admitting: Obstetrics and Gynecology

## 2017-02-03 NOTE — Progress Notes (Signed)
Patient did not keep GYN appointment for 02/03/2017. Tried to call and VM not set up. Will send certified letter.   Olivia Medina, Jr MD Attending Center for [facility](Faculty Practice)

## 2017-02-03 NOTE — Telephone Encounter (Signed)
Telephone call to patient regarding recent no show and need for continued BHCG.  Patient not in, voicemail was not set up.  She needs to reschedule her appointment and to have weekly BHCG until she reaches zero.  [facility] using [facility] # [phone].  Certified letter mailed.

## 2017-02-04 ENCOUNTER — Telehealth: Payer: Self-pay | Admitting: Radiology

## 2017-02-04 NOTE — Telephone Encounter (Signed)
Trying to contact patient to schedule BHCG labs for miscarriage but unable to leave a voicemail due to it not being set up. No other number on file for patient.  Have made two attempts 02/03/17, 02/04/17

## 2017-11-14 ENCOUNTER — Encounter (HOSPITAL_COMMUNITY): Payer: Self-pay

## 2018-06-17 IMAGING — US US OB COMP LESS 14 WK
1 series · 15 of 28 positions shown · non-contrast
Comparison: None.

CLINICAL DATA: Pelvic pain in the first trimester pregnancy. Leg
cramping x2 days with spotting x5 days.

EXAM:
OBSTETRIC <14 WK US AND TRANSVAGINAL OB US
TECHNIQUE: Both transabdominal and transvaginal ultrasound examinations were
performed for complete evaluation of the gestation as well as the
maternal uterus, adnexal regions, and pelvic cul-de-sac.
Transvaginal technique was performed to assess early pregnancy.

[Series 1: us ob comp less 14 wk · 15 of 54 slices shown]
[im 1/54]
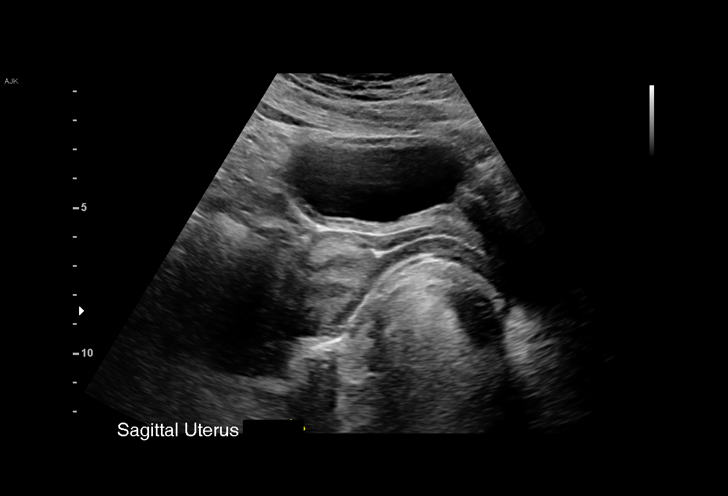
[im 4/54]
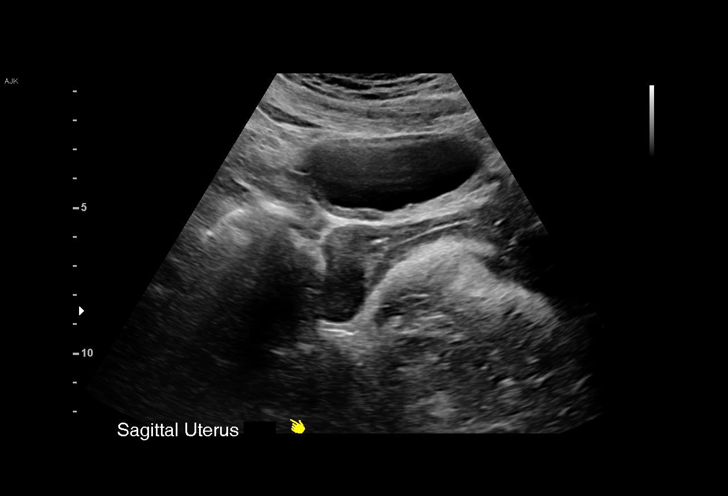
[im 8/54]
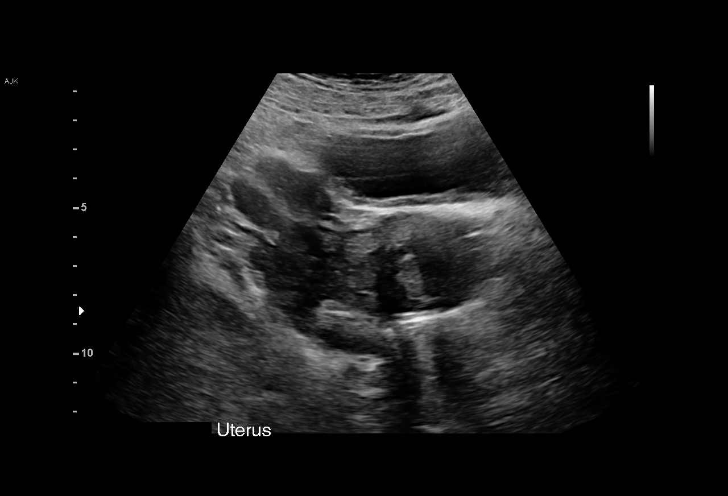
[im 12/54]
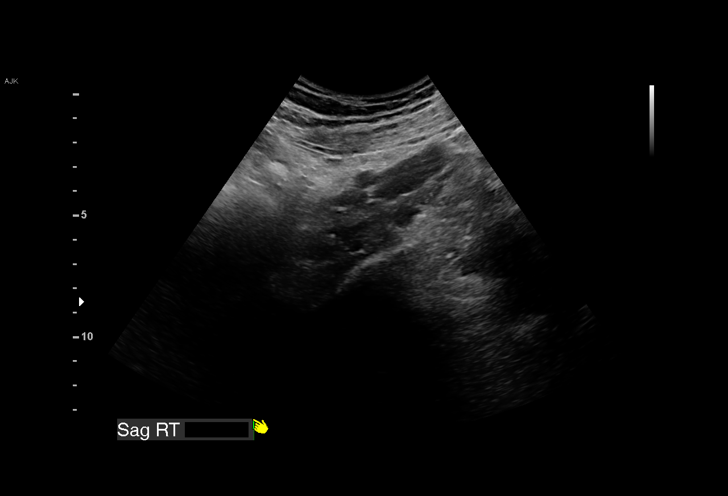
[im 16/54]
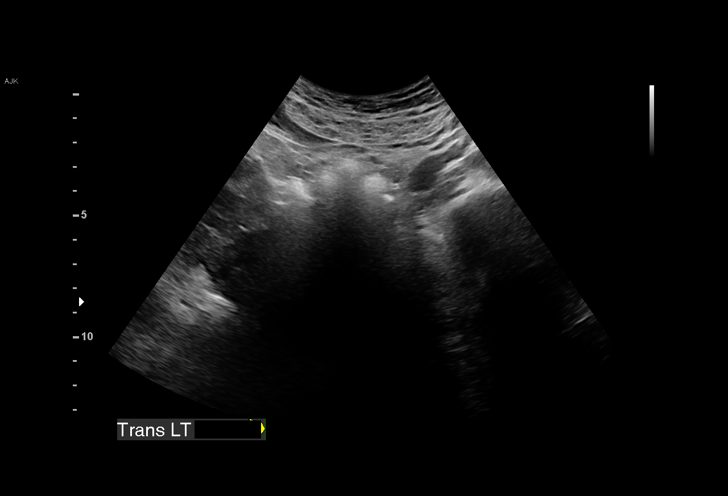
[im 20/54]
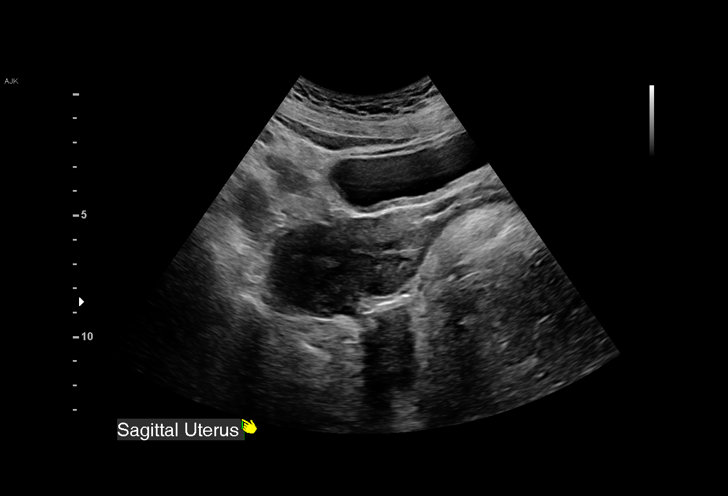
[im 24/54]
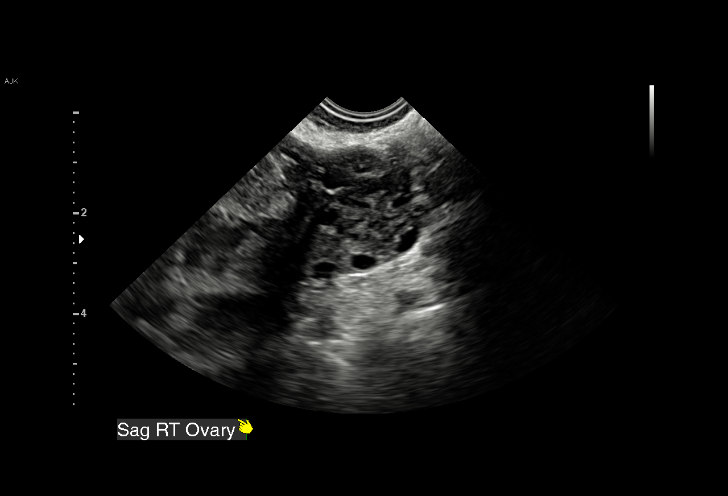
[im 28/54]
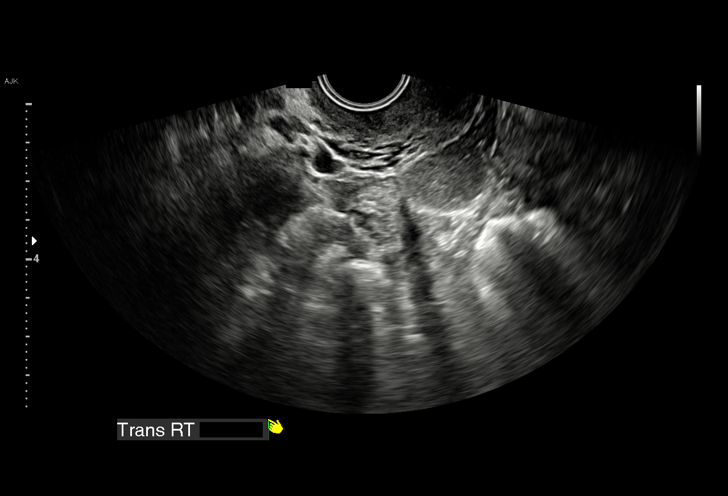
[im 30/54]
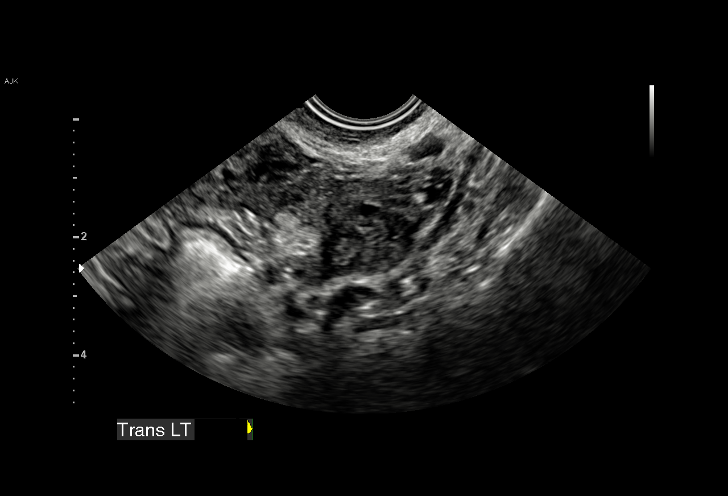
[im 34/54]
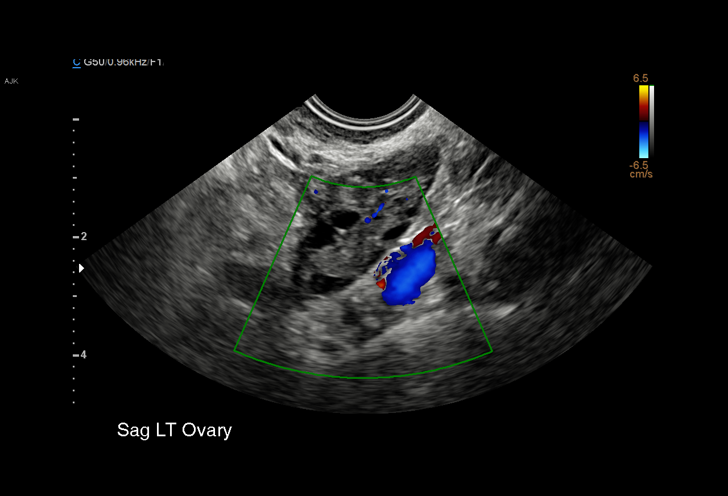
[im 38/54]
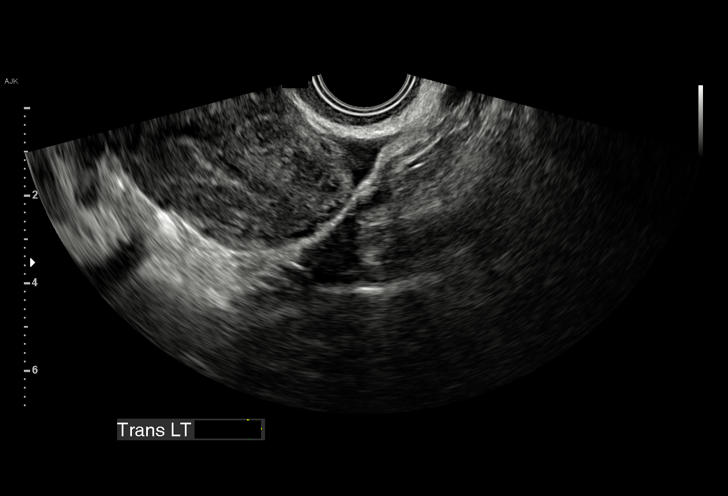
[im 42/54]
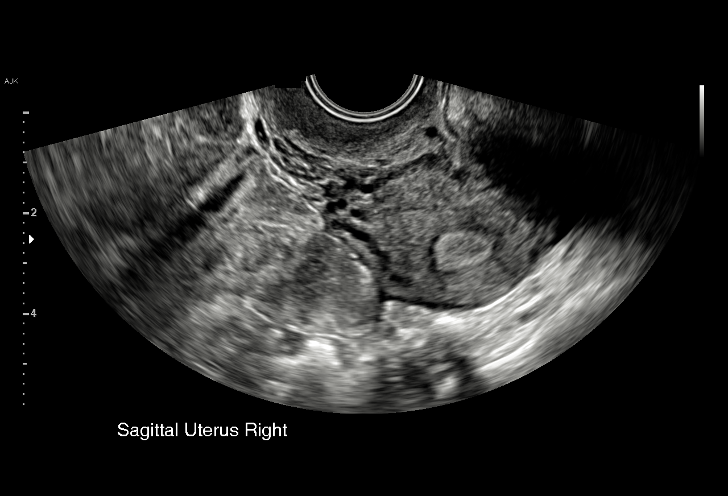
[im 46/54]
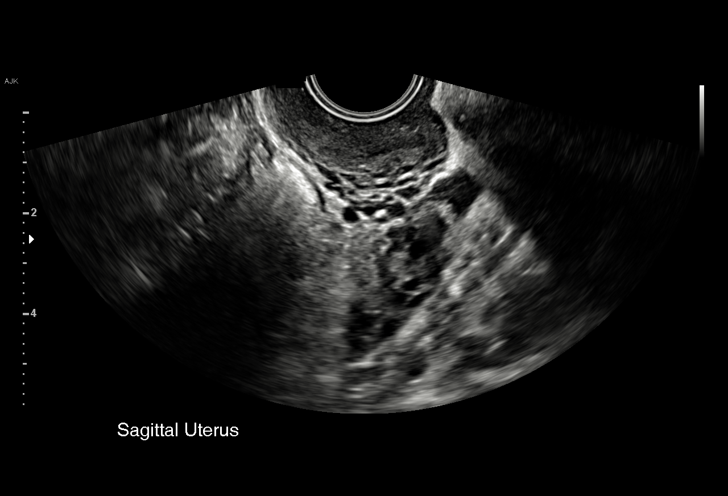
[im 50/54]
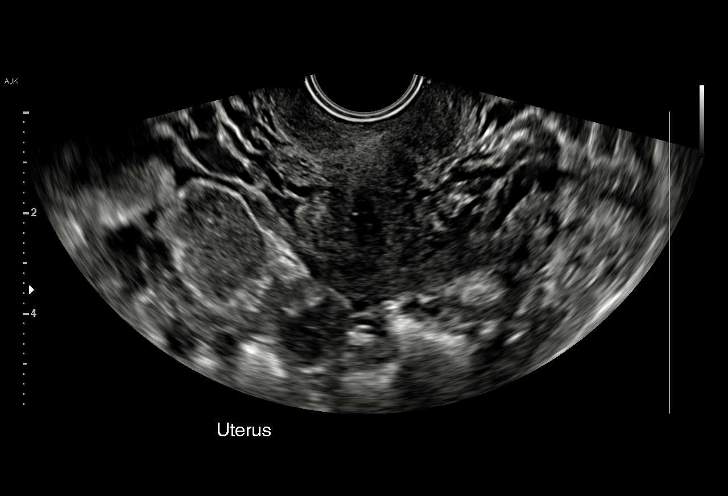
[im 54/54]
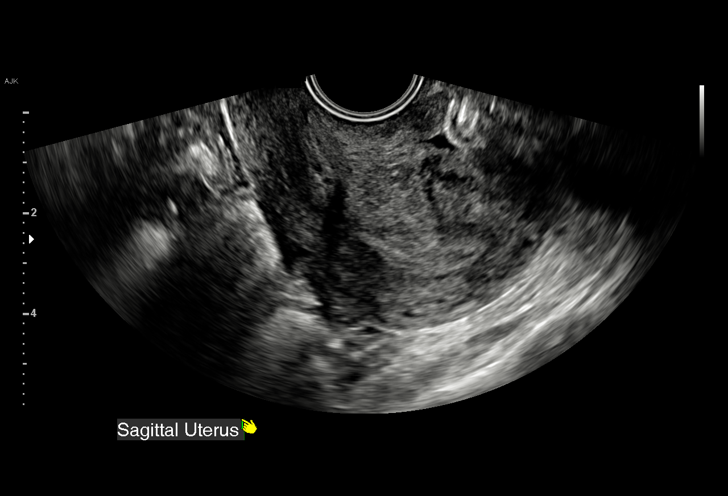

[15 of 28 positions shown; findings below may reference images not displayed]

FINDINGS: Intrauterine gestational sac: Not Visualized.

Yolk sac:  Not Visualized.

Embryo:  Not Visualized.

Cardiac Activity: Not Visualized.

Heart Rate: Not applicable

Subchorionic hemorrhage:  None visualized.

Maternal uterus/adnexae: Small corpus luteal cyst noted of the right
ovary. Left ovary is unremarkable. Trace free fluid is seen in the
pelvis.
IMPRESSION: No intrauterine or ectopic pregnancy is noted.

## 2018-06-25 IMAGING — US US OB TRANSVAGINAL
1 series · 15 of 28 positions shown · non-contrast
Comparison: December 30, 2016.

CLINICAL DATA: Recent ultrasound without visualization of
intrauterine gestation. Positive pregnancy test.

EXAM:
TRANSVAGINAL OB ULTRASOUND; US PELVIS COMPLETE
TECHNIQUE: Transvaginal ultrasound was performed for complete evaluation of the
gestation as well as the maternal uterus, adnexal regions, and
pelvic cul-de-sac.

[Series 1: us ob transvaginal · 33 acquisitions, 15 frames shown]
[im 1/33]
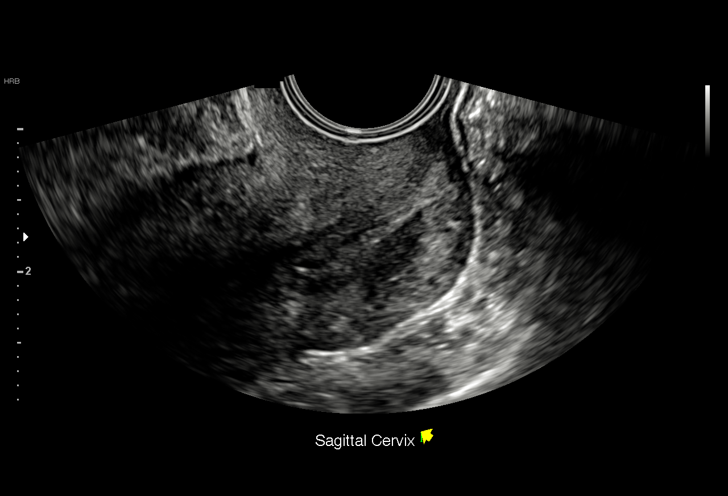
[im 3/33]
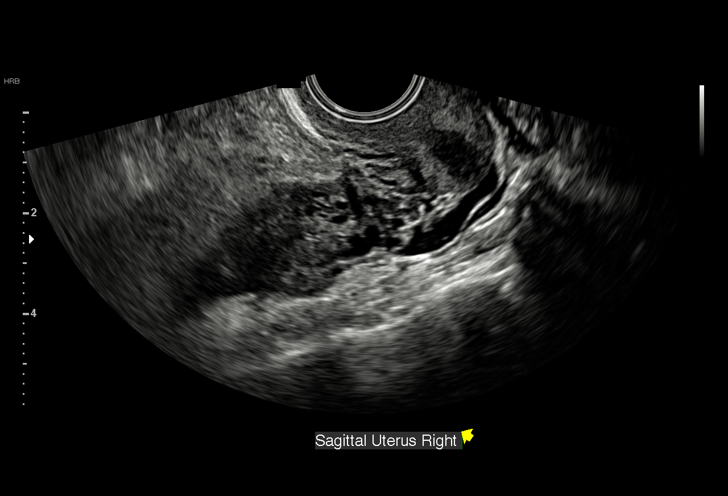
[im 5/33]
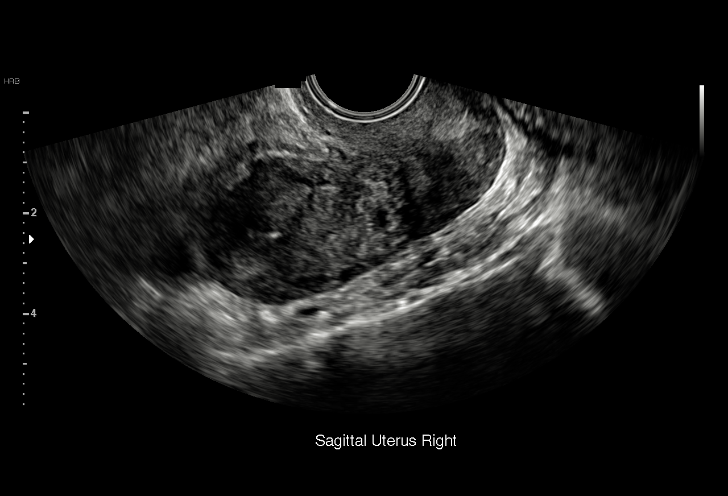
[im 8/33]
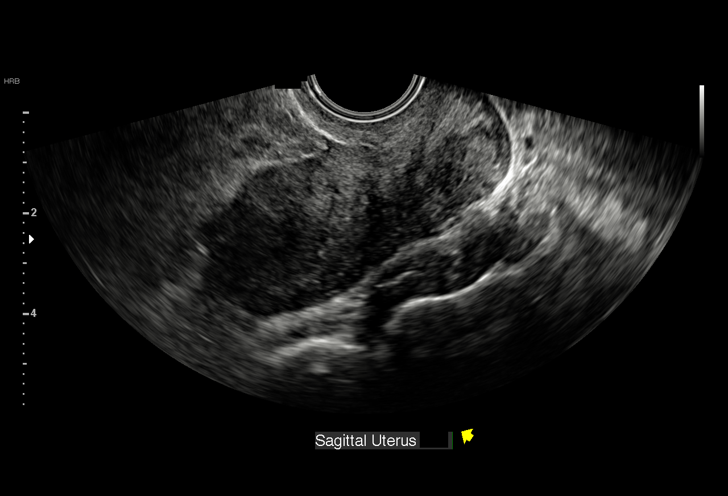
[im 10/33]
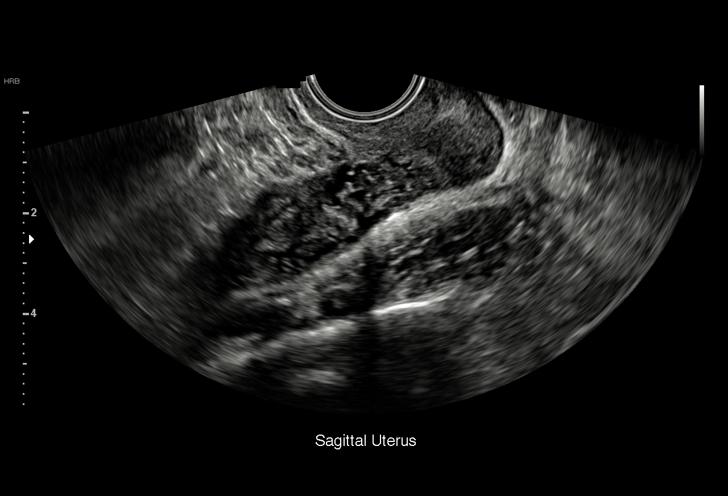
[im 12/33]
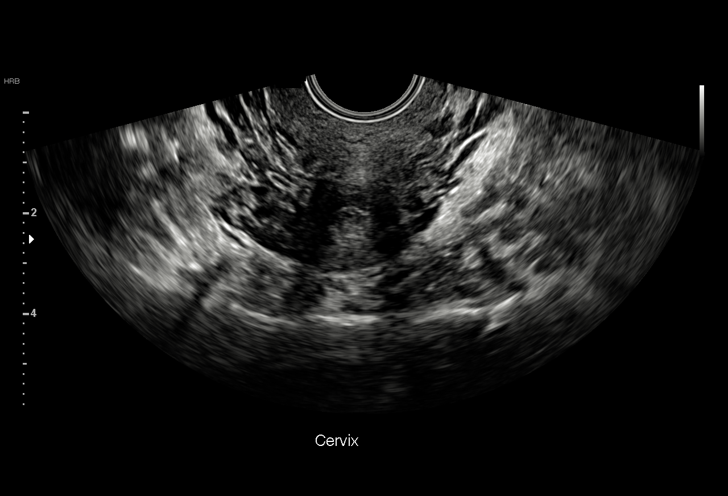
[im 15/33]
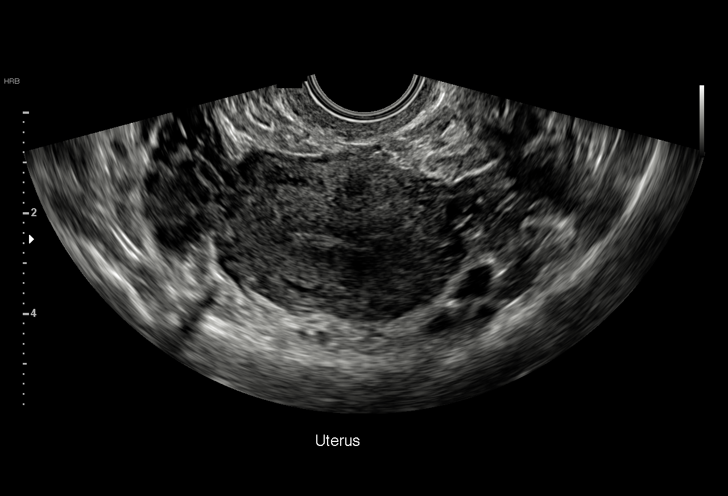
[im 17/33]
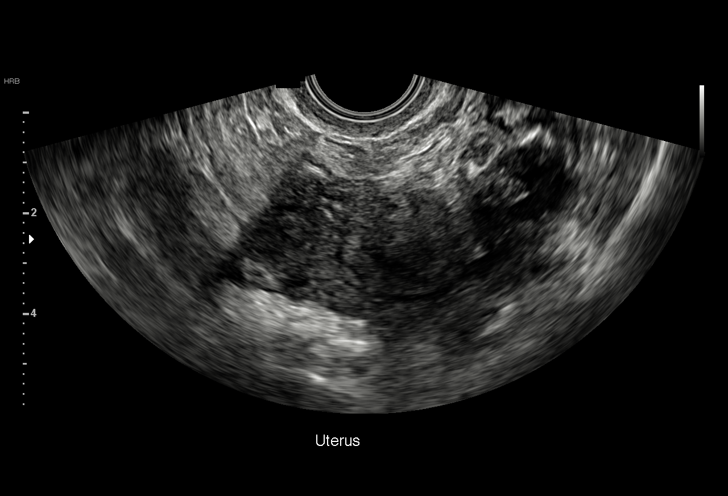
[im 18/33]
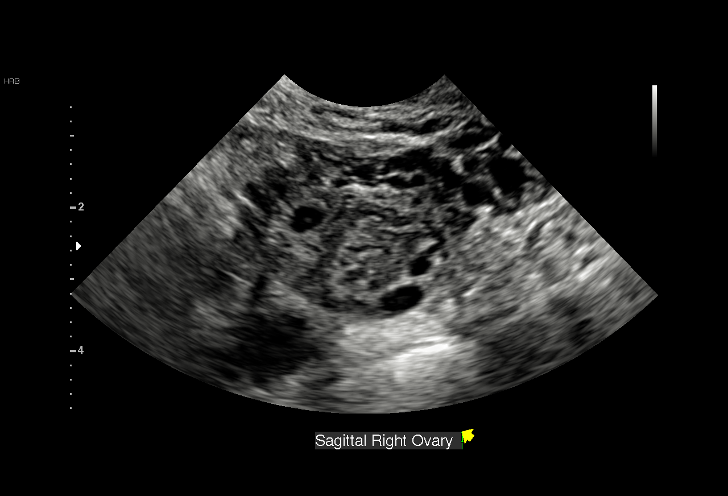
[im 21/33]
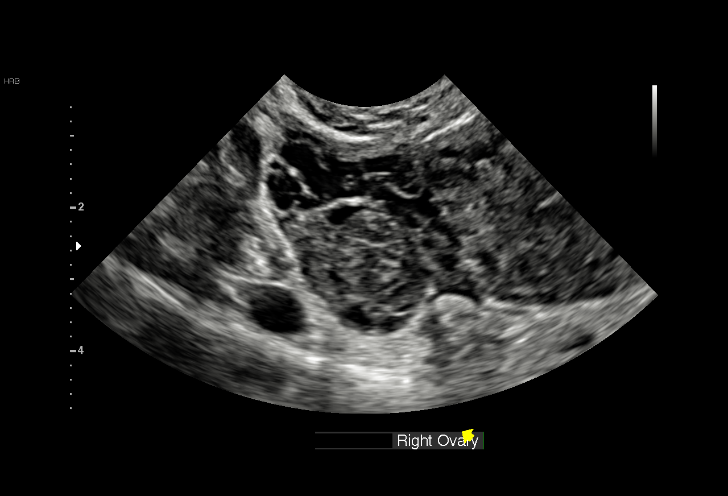
[im 23/33]
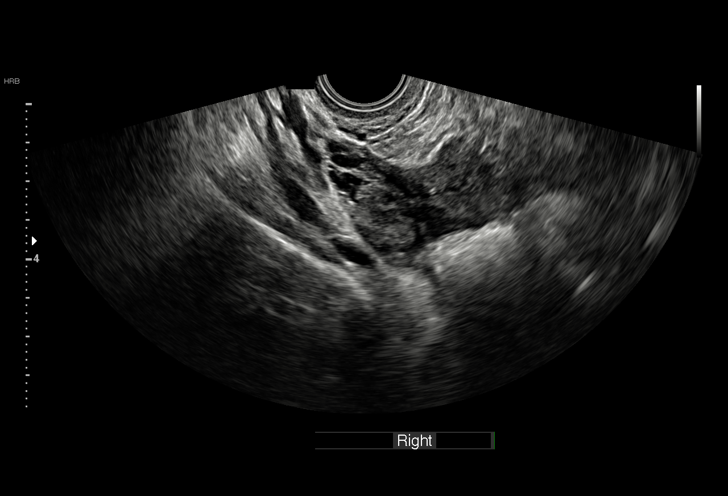
[im 25/33]
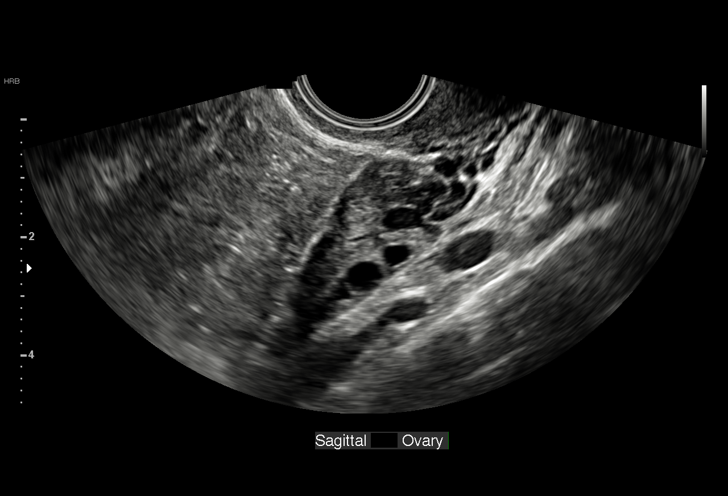
[im 28/33]
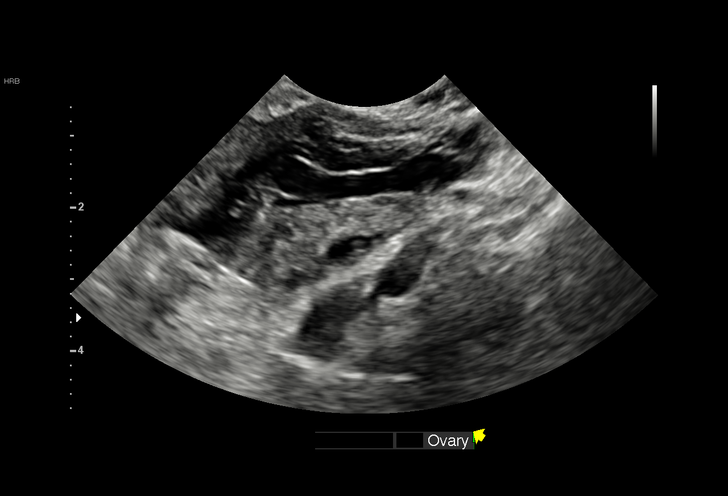
[im 30/33]
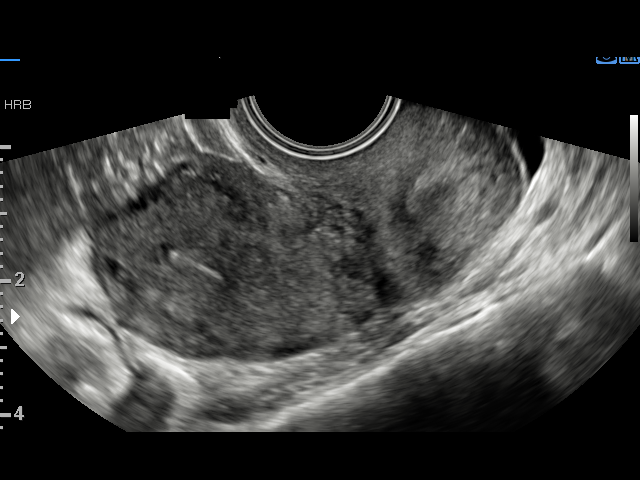
[im 33/33]
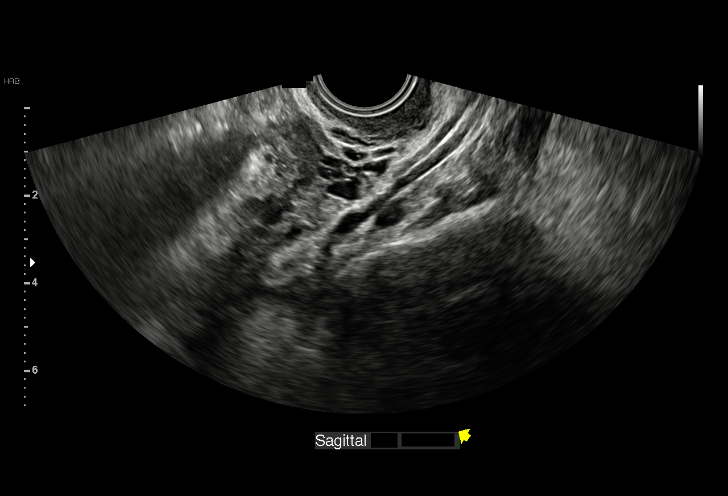

[15 of 28 positions shown; findings below may reference images not displayed]

FINDINGS: Intrauterine gestational sac: Not visualized

Yolk sac:  Not visualized

Embryo:  Not visualized

Cardiac Activity: Not visualized

Subchorionic hemorrhage:  None visualized.

Maternal uterus/adnexae: Uterus measures 6.5 x 4.8 x 3.2 cm. No
intrauterine mass. Cervical os is closed. Endometrium measures 5 mm
in diameter with a smooth contour. Right ovary measures 2.9 x 1.7 x
1.8 cm. Left ovary measures 2.2 x 1.3 x 1.7 cm. There is no
appreciable extrauterine pelvic or adnexal mass. There is rather
minimal free pelvic fluid.
IMPRESSION: There is no demonstrable intrauterine gestation. There is no pelvic
mass or inflammatory focus. There is rather minimal free fluid in
the pelvis which may be physiologic.

Differential considerations for this circumstance and presence of
positive pregnancy test include intrauterine gestation too early to
be seen by either transabdominal or transvaginal technique; recent
spontaneous abortion ; possible ectopic gestation. These findings
warrant close clinical and laboratory correlation. Timing of repeat
ultrasound in part will depend on beta HCG values going forward.

## 2019-04-05 ENCOUNTER — Ambulatory Visit: Payer: Medicaid Other
